# Patient Record
Sex: Male | Born: 1990 | Race: White | Hispanic: No | Marital: Married | Smoking: Current every day smoker
Health system: Southern US, Community
[De-identification: ages and names within clinical notes are randomized; demographics above are authoritative.]

---
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
## 2010-02-06 ENCOUNTER — Ambulatory Visit (HOSPITAL_COMMUNITY): Admission: RE | Admit: 2010-02-06 | Discharge: 2010-02-06 | Payer: Self-pay | Admitting: Psychiatry

## 2010-07-07 LAB — BASIC METABOLIC PANEL
Calcium: 9.3 mg/dL (ref 8.4–10.5)
GFR calc non Af Amer: >60 mL/min (ref >60)
Glucose, Bld: 97 mg/dL (ref 70–99)
Potassium: 4 mEq/L (ref 3.5–5.1)
Sodium: 139 mEq/L (ref 135–145)

## 2010-07-07 LAB — DIFFERENTIAL
Basophils Absolute: 0 10*3/uL (ref 0.0–0.1)
Basophils Relative: 0 % (ref 0–1)
Lymphocytes Relative: 20 % (ref 12–46)
Monocytes Absolute: 0.9 10*3/uL (ref 0.1–1.0)
Monocytes Relative: 9 % (ref 3–12)
Neutro Abs: 6.9 10*3/uL (ref 1.7–7.7)
Neutrophils Relative %: 67 % (ref 43–77)

## 2010-07-07 LAB — CBC
Hemoglobin: 16.3 g/dL (ref 13.0–17.0)
MCHC: 34.3 g/dL (ref 30.0–36.0)
RBC: 5.14 MIL/uL (ref 4.22–5.81)
RDW: 13.1 % (ref 11.5–15.5)

## 2010-07-07 LAB — TRICYCLICS SCREEN, URINE

## 2010-07-07 LAB — RAPID URINE DRUG SCREEN, HOSP PERFORMED
Amphetamines: NOT DETECTED
Benzodiazepines: POSITIVE — AB
Cocaine: NOT DETECTED

## 2010-07-13 LAB — COMPREHENSIVE METABOLIC PANEL
ALT: 16 U/L (ref 0–53)
AST: 20 U/L (ref 0–37)
Albumin: 4 g/dL (ref 3.5–5.2)
CO2: 25 mEq/L (ref 19–32)
Chloride: 106 mEq/L (ref 96–112)
Creatinine, Ser: 1.18 mg/dL (ref 0.4–1.5)
Potassium: 3.9 mEq/L (ref 3.5–5.1)
Sodium: 138 mEq/L (ref 135–145)
Total Bilirubin: 1.3 mg/dL — ABNORMAL HIGH (ref 0.3–1.2)

## 2010-07-13 LAB — URINE MICROSCOPIC-ADD ON

## 2010-07-13 LAB — CBC
HCT: 45.4 % (ref 36.0–49.0)
Platelets: 159 10*3/uL (ref 150–400)
RDW: 13.3 % (ref 11.4–15.5)
WBC: 14.2 10*3/uL — ABNORMAL HIGH (ref 4.5–13.5)

## 2010-07-13 LAB — URINALYSIS, ROUTINE W REFLEX MICROSCOPIC
Glucose, UA: NEGATIVE mg/dL
Hgb urine dipstick: NEGATIVE
Leukocytes, UA: NEGATIVE
Specific Gravity, Urine: 1.021 (ref 1.005–1.030)
pH: 6 (ref 5.0–8.0)

## 2010-07-13 LAB — DIFFERENTIAL
Basophils Relative: 0 % (ref 0–1)
Eosinophils Relative: 0 % (ref 0–5)
Monocytes Absolute: 1 10*3/uL (ref 0.2–1.2)
Monocytes Relative: 7 % (ref 3–11)
Neutro Abs: 12.8 10*3/uL — ABNORMAL HIGH (ref 1.7–8.0)
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
## 2013-11-26 ENCOUNTER — Emergency Department (HOSPITAL_BASED_OUTPATIENT_CLINIC_OR_DEPARTMENT_OTHER)
Admission: EM | Admit: 2013-11-26 | Discharge: 2013-11-26 | Disposition: A | Discharge status: Home / Self Care | Payer: Self-pay | Attending: Emergency Medicine | Admitting: Emergency Medicine

## 2013-11-26 ENCOUNTER — Encounter (HOSPITAL_BASED_OUTPATIENT_CLINIC_OR_DEPARTMENT_OTHER): Payer: Self-pay | Admitting: Emergency Medicine

## 2013-11-26 DIAGNOSIS — F17200 Nicotine dependence, unspecified, uncomplicated: Secondary | ICD-10-CM | POA: Insufficient documentation

## 2013-11-26 DIAGNOSIS — L237 Allergic contact dermatitis due to plants, except food: Secondary | ICD-10-CM

## 2013-11-26 DIAGNOSIS — IMO0002 Reserved for concepts with insufficient information to code with codable children: Secondary | ICD-10-CM | POA: Insufficient documentation

## 2013-11-26 DIAGNOSIS — R21 Rash and other nonspecific skin eruption: Secondary | ICD-10-CM | POA: Insufficient documentation

## 2013-11-26 DIAGNOSIS — L255 Unspecified contact dermatitis due to plants, except food: Secondary | ICD-10-CM | POA: Insufficient documentation

## 2013-11-26 MED ORDER — TRIAMCINOLONE ACETONIDE 0.1 % EX CREA: 1.0000 "application " | TOPICAL_CREAM | Freq: Two times a day (BID) | CUTANEOUS | Status: DC

## 2013-11-26 NOTE — ED Provider Notes (Signed)
CSN: [id number]     Arrival date & time 11/26/13  1214 History   First MD Initiated Contact with Patient 11/26/13 1241     Chief Complaint  Patient presents with  . Rash     (Consider location/radiation/quality/duration/timing/severity/associated sxs/prior Treatment) HPI Comments: Pt states that his daughter was diagnosed with impetigo and he wanted to make sure that he didn't have it. Works outside  Patient is a 23 y.o. male presenting with rash. The history is provided by the patient. No language interpreter was used.  Rash Location:  Leg Quality: itchiness and redness   Severity:  Mild Onset quality:  Sudden Duration:  1 week Timing:  Constant Progression:  Unchanged Chronicity:  New Relieved by:  Nothing Worsened by:  Nothing tried Ineffective treatments:  None tried Associated symptoms: no fever     History reviewed. No pertinent past medical history. History reviewed. No pertinent past surgical history. No family history on file. History  Substance Use Topics  . Smoking status: Current Every Day Smoker -- 0.50 packs/day    Types: Cigarettes  . Smokeless tobacco: Not on file  . Alcohol Use: Yes     Comment: occasional    Review of Systems  Constitutional: Negative for fever.  Respiratory: Negative.   Skin: Positive for rash.      Allergies  Review of patient's allergies indicates no known allergies.  Home Medications   Prior to Admission medications   Medication Sig Start Date End Date Taking? Authorizing Provider  triamcinolone cream (KENALOG) 0.1 % Apply 1 application topically 2 (two) times daily. 11/26/13   Teressa Lower, NP   BP 119/70  Pulse 85  Temp(Src) 97.5 F (36.4 C) (Oral)  Resp 16  Ht 6' (1.829 m)  Wt 145 lb (65.772 kg)  BMI 19.66 kg/m2  SpO2 100% Physical Exam  Nursing note and vitals reviewed. Constitutional: He is oriented to person, place, and time. He appears well-developed and well-nourished.  Cardiovascular: Normal rate  and regular rhythm.   Pulmonary/Chest: Effort normal and breath sounds normal.  Musculoskeletal: Normal range of motion.  Neurological: He is alert and oriented to person, place, and time.  Skin:  A few area of red raised papules to the bilateral ankles. No drainage or warmth noted    ED Course  Procedures (including critical care time) Labs Review Labs Reviewed - No data to display  Imaging Review No results found.   EKG Interpretation None      MDM   Final diagnoses:  Poison ivy    Consistent with poison ivy. Very localized and appears to be healing will treat with triamcinolone     Teressa Lower, NP 11/26/13 1305

## 2013-11-26 NOTE — ED Notes (Signed)
Generalized rash x1 week. 

## 2013-11-26 NOTE — Discharge Instructions (Signed)
Contact Dermatitis °Contact dermatitis is a rash that happens when something touches the skin. You touched something that irritates your skin, or you have allergies to something you touched. °HOME CARE  °· Avoid the thing that caused your rash. °· Keep your rash away from hot water, soap, sunlight, chemicals, and other things that might bother it. °· Do not scratch your rash. °· You can take cool baths to help stop itching. °· Only take medicine as told by your doctor. °· Keep all doctor visits as told. °GET HELP RIGHT AWAY IF:  °· Your rash is not better after 3 days. °· Your rash gets worse. °· Your rash is puffy (swollen), tender, red, sore, or warm. °· You have problems with your medicine. °MAKE SURE YOU:  °· Understand these instructions. °· Will watch your condition. °· Will get help right away if you are not doing well or get worse. °Document Released: 01/17/2009 Document Revised: 06/14/2011 Document Reviewed: 08/25/2010 °ExitCare® Patient Information ©2015 [employer]. This information is not intended to replace advice given to you by your health care provider. Make sure you discuss any questions you have with your health care provider. ° °

## 2013-11-27 NOTE — ED Provider Notes (Signed)
Medical screening examination/treatment/procedure(s) were performed by non-physician practitioner and as supervising physician I was immediately available for consultation/collaboration.     Geoffery Lyons, MD 11/27/13 [phone]

## 2013-12-11 ENCOUNTER — Encounter (HOSPITAL_COMMUNITY): Payer: Self-pay | Admitting: Emergency Medicine

## 2013-12-11 ENCOUNTER — Emergency Department (HOSPITAL_COMMUNITY)
Admission: EM | Admit: 2013-12-11 | Discharge: 2013-12-11 | Disposition: A | Discharge status: Home / Self Care | Payer: Self-pay | Attending: Emergency Medicine | Admitting: Emergency Medicine

## 2013-12-11 DIAGNOSIS — N201 Calculus of ureter: Secondary | ICD-10-CM

## 2013-12-11 DIAGNOSIS — F17200 Nicotine dependence, unspecified, uncomplicated: Secondary | ICD-10-CM | POA: Insufficient documentation

## 2013-12-11 DIAGNOSIS — N509 Disorder of male genital organs, unspecified: Secondary | ICD-10-CM | POA: Insufficient documentation

## 2013-12-11 DIAGNOSIS — IMO0002 Reserved for concepts with insufficient information to code with codable children: Secondary | ICD-10-CM | POA: Insufficient documentation

## 2013-12-11 LAB — URINALYSIS, ROUTINE W REFLEX MICROSCOPIC
Glucose, UA: NEGATIVE mg/dL
Ketones, ur: 40 mg/dL — AB
LEUKOCYTES UA: NEGATIVE
NITRITE: NEGATIVE
PH: 6 (ref 5.0–8.0)
Protein, ur: 30 mg/dL — AB
SPECIFIC GRAVITY, URINE: 1.03 (ref 1.005–1.030)
UROBILINOGEN UA: 2 mg/dL — AB (ref 0.0–1.0)

## 2013-12-11 LAB — BASIC METABOLIC PANEL
ANION GAP: 15 (ref 5–15)
BUN: 14 mg/dL (ref 6–23)
CHLORIDE: 103 meq/L (ref 96–112)
CO2: 23 meq/L (ref 19–32)
CREATININE: 1.33 mg/dL (ref 0.50–1.35)
Calcium: 10.2 mg/dL (ref 8.4–10.5)
GFR calc non Af Amer: 74 mL/min — ABNORMAL LOW (ref >90)
GFR, EST AFRICAN AMERICAN: 86 mL/min — AB (ref >90)
Glucose, Bld: 115 mg/dL — ABNORMAL HIGH (ref 70–99)
POTASSIUM: 4.6 meq/L (ref 3.7–5.3)
SODIUM: 141 meq/L (ref 137–147)

## 2013-12-11 LAB — URINE MICROSCOPIC-ADD ON

## 2013-12-11 LAB — CBC WITH DIFFERENTIAL/PLATELET
BASOS ABS: 0 10*3/uL (ref 0.0–0.1)
BASOS PCT: 0 % (ref 0–1)
Eosinophils Absolute: 0.1 10*3/uL (ref 0.0–0.7)
Eosinophils Relative: 1 % (ref 0–5)
HEMATOCRIT: 45.4 % (ref 39.0–52.0)
Hemoglobin: 15.5 g/dL (ref 13.0–17.0)
LYMPHS PCT: 8 % — AB (ref 12–46)
Lymphs Abs: 1.1 10*3/uL (ref 0.7–4.0)
MCH: 30.3 pg (ref 26.0–34.0)
MCHC: 34.1 g/dL (ref 30.0–36.0)
MCV: 88.8 fL (ref 78.0–100.0)
MONO ABS: 0.9 10*3/uL (ref 0.1–1.0)
Monocytes Relative: 7 % (ref 3–12)
NEUTROS ABS: 11 10*3/uL — AB (ref 1.7–7.7)
NEUTROS PCT: 84 % — AB (ref 43–77)
Platelets: 208 10*3/uL (ref 150–400)
RBC: 5.11 MIL/uL (ref 4.22–5.81)
RDW: 12.5 % (ref 11.5–15.5)
WBC: 13.1 10*3/uL — AB (ref 4.0–10.5)

## 2013-12-11 NOTE — ED Notes (Addendum)
Pt reports L groin pain radiating to his L testicle.  Pt reports urinary retention, last time he voided was at 1430 today.   Pt also reports that he thought that it was just because he's been constipated, LBM x 3 days ago.  Has used laxative with relief.  Has helped the pain some.

## 2013-12-11 NOTE — Discharge Instructions (Signed)

## 2013-12-11 NOTE — ED Provider Notes (Signed)
CSN: [id number]     Arrival date & time 12/11/13  2022 History   First MD Initiated Contact with Patient 12/11/13 2104     Chief Complaint  Patient presents with  . Testicle Pain     (Consider location/radiation/quality/duration/timing/severity/associated sxs/prior Treatment) Patient is a 23 y.o. male presenting with testicular pain. The history is provided by the patient.  Testicle Pain Associated symptoms include abdominal pain. Pertinent negatives include no chest pain, no headaches and no shortness of breath.   patient presented with testicular abdominal pain. States that around 5:30 today, 4 hours prior to arrival he developed a pain like he had to urinate. He states he couldn't really go. He then had severe pain in his left testicle. It then went up to his left abdomen. Is also some pain in his left flank. He states the pain would go between his testicle in his abdomen. No fevers. No nausea no vomiting. No diarrhea. No penile discharge. He states he did have a little constipation. He states he took an enema and that relieves constipation without release for the pain. Patient states he feels somewhat better now.  History reviewed. No pertinent past medical history. History reviewed. No pertinent past surgical history. No family history on file. History  Substance Use Topics  . Smoking status: Current Every Day Smoker -- 0.50 packs/day    Types: Cigarettes  . Smokeless tobacco: Not on file  . Alcohol Use: Yes     Comment: occasional    Review of Systems  Constitutional: Negative for activity change and appetite change.  Eyes: Negative for pain.  Respiratory: Negative for chest tightness and shortness of breath.   Cardiovascular: Negative for chest pain and leg swelling.  Gastrointestinal: Positive for abdominal pain. Negative for nausea, vomiting and diarrhea.  Genitourinary: Positive for dysuria and testicular pain. Negative for flank pain.  Musculoskeletal: Negative for back  pain and neck stiffness.  Skin: Negative for rash.  Neurological: Negative for weakness, numbness and headaches.  Psychiatric/Behavioral: Negative for behavioral problems.      Allergies  Review of patient's allergies indicates no known allergies.  Home Medications   Prior to Admission medications   Medication Sig Start Date End Date Taking? Authorizing Provider  acetaminophen (TYLENOL) 500 MG tablet Take 1,000 mg by mouth every 6 (six) hours as needed for mild pain.   Yes Historical Provider, MD  ibuprofen (ADVIL,MOTRIN) 600 MG tablet Take 600 mg by mouth every 6 (six) hours as needed for moderate pain.   Yes Historical Provider, MD  triamcinolone cream (KENALOG) 0.1 % Apply 1 application topically 2 (two) times daily. 11/26/13  Yes Teressa Lower, NP   BP 115/60  Pulse 55  Resp 22  SpO2 99% Physical Exam  Nursing note and vitals reviewed. Constitutional: He is oriented to person, place, and time. He appears well-developed and well-nourished.  HENT:  Head: Normocephalic and atraumatic.  Cardiovascular: Normal rate, regular rhythm and normal heart sounds.   No murmur heard. Pulmonary/Chest: Effort normal and breath sounds normal.  Abdominal: Soft. Bowel sounds are normal. He exhibits no distension and no mass. There is no tenderness. There is no rebound and no guarding.  Genitourinary: Prostate normal and penis normal. No penile tenderness.  Normal testicular lie bilaterally. No testicular tenderness. No hernia palpated. No CVA tenderness on left. Cremasteric reflex intact on left.  Musculoskeletal: Normal range of motion. He exhibits no edema.  Neurological: He is alert and oriented to person, place, and time. No cranial nerve deficit.  Skin: Skin is warm and dry.  Psychiatric: He has a normal mood and affect.    ED Course  Procedures (including critical care time) Labs Review Labs Reviewed  CBC WITH DIFFERENTIAL - Abnormal; Notable for the following:    WBC 13.1 (*)     Neutrophils Relative % 84 (*)    Neutro Abs 11.0 (*)    Lymphocytes Relative 8 (*)    All other components within normal limits  BASIC METABOLIC PANEL - Abnormal; Notable for the following:    Glucose, Bld 115 (*)    GFR calc non Af Amer 74 (*)    GFR calc Af Amer 86 (*)    All other components within normal limits  URINALYSIS, ROUTINE W REFLEX MICROSCOPIC - Abnormal; Notable for the following:    Color, Urine AMBER (*)    APPearance CLOUDY (*)    Hgb urine dipstick LARGE (*)    Bilirubin Urine SMALL (*)    Ketones, ur 40 (*)    Protein, ur 30 (*)    Urobilinogen, UA 2.0 (*)    All other components within normal limits  URINE MICROSCOPIC-ADD ON - Abnormal; Notable for the following:    Bacteria, UA FEW (*)    All other components within normal limits    Imaging Review No results found.   EKG Interpretation None      MDM   Final diagnoses:  Ureteral stone    Patient with flank pain abdominal pain and testicular pain. Thought to be likely kidney stone, then on the patient's urine sample he passed a visible stone. No infection. Patient improved. Will discharge home. Family has history of similar stones.    Juliet Rude. Rubin Payor, MD 12/11/13 [phone]

## 2013-12-11 NOTE — ED Notes (Signed)
Pt ambulated to bathroom 

## 2015-01-22 ENCOUNTER — Emergency Department (HOSPITAL_COMMUNITY)
Admission: EM | Admit: 2015-01-22 | Discharge: 2015-01-22 | Disposition: A | Discharge status: Home / Self Care | Payer: Worker's Compensation | Attending: Emergency Medicine | Admitting: Emergency Medicine

## 2015-01-22 ENCOUNTER — Encounter (HOSPITAL_COMMUNITY): Payer: Self-pay

## 2015-01-22 DIAGNOSIS — T24232A Burn of second degree of left lower leg, initial encounter: Secondary | ICD-10-CM | POA: Diagnosis not present

## 2015-01-22 DIAGNOSIS — Z23 Encounter for immunization: Secondary | ICD-10-CM | POA: Diagnosis not present

## 2015-01-22 DIAGNOSIS — T25222A Burn of second degree of left foot, initial encounter: Secondary | ICD-10-CM | POA: Insufficient documentation

## 2015-01-22 DIAGNOSIS — Y990 Civilian activity done for income or pay: Secondary | ICD-10-CM | POA: Insufficient documentation

## 2015-01-22 DIAGNOSIS — Y272XXA Contact with hot fluids, undetermined intent, initial encounter: Secondary | ICD-10-CM | POA: Insufficient documentation

## 2015-01-22 DIAGNOSIS — Y9289 Other specified places as the place of occurrence of the external cause: Secondary | ICD-10-CM | POA: Insufficient documentation

## 2015-01-22 DIAGNOSIS — Y9389 Activity, other specified: Secondary | ICD-10-CM | POA: Diagnosis not present

## 2015-01-22 DIAGNOSIS — Z720 Tobacco use: Secondary | ICD-10-CM | POA: Diagnosis not present

## 2015-01-22 MED ORDER — OXYCODONE-ACETAMINOPHEN 5-325 MG PO TABS: 1.0000 | ORAL_TABLET | ORAL | Status: AC | PRN

## 2015-01-22 MED ORDER — OXYCODONE-ACETAMINOPHEN 5-325 MG PO TABS: 1.0000 | ORAL_TABLET | ORAL | Status: DC | PRN

## 2015-01-22 MED ORDER — OXYCODONE-ACETAMINOPHEN 5-325 MG PO TABS
2.0000 | ORAL_TABLET | Freq: Once | ORAL | Status: AC
  Administered 2015-01-22: 2 via ORAL
  Filled 2015-01-22: qty 2

## 2015-01-22 MED ORDER — BACITRACIN ZINC 500 UNIT/GM EX OINT: 1.0000 "application " | TOPICAL_OINTMENT | Freq: Three times a day (TID) | CUTANEOUS | Status: DC

## 2015-01-22 MED ORDER — TETANUS-DIPHTH-ACELL PERTUSSIS 5-2.5-18.5 LF-MCG/0.5 IM SUSP
0.5000 mL | Freq: Once | INTRAMUSCULAR | Status: AC
  Administered 2015-01-22: 0.5 mL via INTRAMUSCULAR
  Filled 2015-01-22: qty 0.5

## 2015-01-22 MED ORDER — BACITRACIN ZINC 500 UNIT/GM EX OINT: 1.0000 "application " | TOPICAL_OINTMENT | Freq: Three times a day (TID) | CUTANEOUS | Status: AC

## 2015-01-22 MED ORDER — BACITRACIN ZINC 500 UNIT/GM EX OINT
TOPICAL_OINTMENT | Freq: Once | CUTANEOUS | Status: AC
  Administered 2015-01-22: 1 via TOPICAL
  Filled 2015-01-22: qty 0.9

## 2015-01-22 NOTE — Discharge Instructions (Signed)
Burn Care °Your skin is a natural barrier to infection. It is the largest organ of your body. Burns damage this natural protection. To help prevent infection, it is very important to follow your caregiver's instructions in the care of your burn. °Burns are classified as: °· First degree. There is only redness of the skin (erythema). No scarring is expected. °· Second degree. There is blistering of the skin. Scarring may occur with deeper burns. °· Third degree. All layers of the skin are injured, and scarring is expected. °HOME CARE INSTRUCTIONS  °· Wash your hands well before changing your bandage. °· Change your bandage as often as directed by your caregiver. °· Remove the old bandage. If the bandage sticks, you may soak it off with cool, clean water. °· Cleanse the burn thoroughly but gently with mild soap and water. °· Pat the area dry with a clean, dry cloth. °· Apply a thin layer of antibacterial cream to the burn. °· Apply a clean bandage as instructed by your caregiver. °· Keep the bandage as clean and dry as possible. °· Elevate the affected area for the first 24 hours, then as instructed by your caregiver. °· Only take over-the-counter or prescription medicines for pain, discomfort, or fever as directed by your caregiver. °SEEK IMMEDIATE MEDICAL CARE IF:  °· You develop excessive pain. °· You develop redness, tenderness, swelling, or red streaks near the burn. °· The burned area develops yellowish-white fluid (pus) or a bad smell. °· You have a fever. °MAKE SURE YOU:  °· Understand these instructions. °· Will watch your condition. °· Will get help right away if you are not doing well or get worse. °  °This information is not intended to replace advice given to you by your health care provider. Make sure you discuss any questions you have with your health care provider. °  °Document Released: 03/22/2005 Document Revised: 06/14/2011 Document Reviewed: 08/12/2010 °Elsevier Interactive Patient Education ©2016  [employer] ° °Second-Degree Burn °A second-degree burn affects the 2 outer layers of skin. The outer layer (epidermis) and the layer underneath it (dermis) are both burned. Another name for this type of burn is a partial thickness burn. A second-degree burn may be called minor or major. This depends on the size of the burn. It also depends on what parts of the skin are burned. Minor burns may be treated with first aid. Major burns are a medical emergency. °A second-degree burn is worse than a first-degree burn, but not as bad as a third-degree burn. A first-degree burn affects only the epidermis. A third-degree burn goes through all the layers of skin. A second-degree burn usually heals in 3 to 4 weeks. A minor second-degree burn usually does not leave a scar. Deeper second-degree burns may lead to scarring of the skin or contractures over joints. Contractures are scars that form over joints and may lead to reduced mobility at those joints. °CAUSES °· Heat (thermal) injury. This happens when skin comes in contact with something very hot. It could be a flame, a hot object, hot liquid, or steam. Most second-degree burns are thermal injuries. °· Radiation. Sunlight is one type of radiation that can burn the skin. Another type of radiation is used to heat food. Radiation is also used to treat some diseases, such as cancer. All types of radiation can burn the skin. Sunlight usually causes a first-degree burn. Radiation used for heating food or treating a disease can cause a second-degree burn. °· Electricity. Electrical burns can cause more   damage under the skin than on the surface. They should always be treated as major burns. °· Chemicals. Many chemicals can burn the skin. The burn should be flushed with cool water and checked by an emergency caregiver. °SYMPTOMS °Symptoms of second-degree burns include: °· Severe pain. °· Extreme tenderness. °· Deep redness. °· Blistered skin. °· Skin that has changed color. It  might look blotchy, wet, or shiny. °· Swelling. °TREATMENT °Some second-degree burns may need to be treated in a hospital. These include major burns, electrical burns, and chemical burns. Many other second-degree burns can be treated with regular first aid, such as: °· Cooling the burn. Use cool, germ-free (sterile) salt water. Place the burned area of skin into a tub of water, or cover the burned area with clean, wet towels. °· Taking pain medicine. °· Removing the dead skin from broken blisters. A trained caregiver may do this. Do not pop blisters. °· Gently washing your skin with mild soap. °· Covering the burned area with a cream. Silver sulfadiazine is a cream for burns. An antibiotic cream, such as bacitracin, may also be used to fight infection. Do not use other ointments or creams unless your caregiver says it is okay. °· Protecting the burn with a sterile, non-sticky bandage. °· Bandaging fingers and toes separately. This keeps them from sticking together. °· Taking an antibiotic. This can help prevent infection. °· Getting a tetanus shot. °HOME CARE INSTRUCTIONS °Medication °· Take any medicine prescribed by your caregiver. Follow the directions carefully. °· Ask your caregiver if you can take over-the-counter medicine to relieve pain and swelling. Do not give aspirin to children. °· Make sure your caregiver knows about all other medicines you take. This includes over-the-counter medicines. °Burn care °· You will need to change the bandage on your burn. You may need to do this 2 or 3 times each day. °¨ Gently clean the burned area. °¨ Put ointment on it. °¨ Cover the burn with a sterile bandage. °· For some deeper burns or burns that cover a large area, compression garments may be prescribed. These garments can help minimize scarring and protect your mobility. °· Do not put butter or oil on your skin. Use only the cream prescribed by your caregiver. °· Do not put ice on your burn. °· Do not break blisters  on your skin. °· Keep the bandaged area dry. You might need to take a sponge bath for awhile. Ask your caregiver when you can take a shower or a tub bath again. °· Do not scratch an itchy burn. Your caregiver may give you medicine to relieve very bad itching. °· Infection is a big danger after a second-degree burn. Tell your caregiver right away if you have signs of infection, such as: °¨ Redness or changing color in the burned area. °¨ Fluid leaking from the burn. °¨ Swelling in the burn area. °¨ A bad smell coming from the wound. °Follow-up °· Keep all follow-up appointments. This is important. This is how your caregiver can tell if your treatment is working. °· Protect your burn from sunlight. Use sunscreen whenever you go outside. Burned areas may be sensitive to the sun for up to 1 year. Exposure to the sun may also cause permanent darkening of scars. °SEEK MEDICAL CARE IF: °· You have any questions about medicines. °· You have any questions about your treatment. °· You wonder if it is okay to do a particular activity. °· You develop a fever of more than 100.5° F (38.1° C). °SEEK IMMEDIATE MEDICAL CARE IF: °·   You think your burn might be infected. It may change color, become red, leak fluid, swell, or smell bad. °· You develop a fever of more than 102° F (38.9° C). °  °This information is not intended to replace advice given to you by your health care provider. Make sure you discuss any questions you have with your health care provider. °  °Document Released: 08/24/2010 Document Revised: 06/14/2011 Document Reviewed: 08/24/2010 °Elsevier Interactive Patient Education ©2016 [employer] Inc[employer] ° °
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
## 2020-09-09 ENCOUNTER — Encounter (HOSPITAL_BASED_OUTPATIENT_CLINIC_OR_DEPARTMENT_OTHER): Payer: Self-pay | Admitting: *Deleted

## 2020-09-09 ENCOUNTER — Emergency Department (HOSPITAL_BASED_OUTPATIENT_CLINIC_OR_DEPARTMENT_OTHER): Payer: Managed Care, Other (non HMO)

## 2020-09-09 ENCOUNTER — Other Ambulatory Visit: Payer: Self-pay

## 2020-09-09 DIAGNOSIS — S1011XA Abrasion of throat, initial encounter: Secondary | ICD-10-CM | POA: Diagnosis not present

## 2020-09-09 DIAGNOSIS — S60221A Contusion of right hand, initial encounter: Secondary | ICD-10-CM | POA: Diagnosis not present

## 2020-09-09 DIAGNOSIS — F17210 Nicotine dependence, cigarettes, uncomplicated: Secondary | ICD-10-CM | POA: Diagnosis not present

## 2020-09-09 DIAGNOSIS — S6991XA Unspecified injury of right wrist, hand and finger(s), initial encounter: Secondary | ICD-10-CM | POA: Diagnosis present

## 2020-09-09 DIAGNOSIS — F419 Anxiety disorder, unspecified: Secondary | ICD-10-CM | POA: Insufficient documentation
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
## 2020-09-10 ENCOUNTER — Emergency Department (HOSPITAL_BASED_OUTPATIENT_CLINIC_OR_DEPARTMENT_OTHER)
Admission: EM | Admit: 2020-09-10 | Discharge: 2020-09-10 | Disposition: A | Discharge status: Home / Self Care | Payer: Managed Care, Other (non HMO) | Attending: Emergency Medicine | Admitting: Emergency Medicine

## 2020-09-10 DIAGNOSIS — S60221A Contusion of right hand, initial encounter: Secondary | ICD-10-CM

## 2020-09-10 DIAGNOSIS — F419 Anxiety disorder, unspecified: Secondary | ICD-10-CM

## 2020-09-10 MED ORDER — IBUPROFEN 600 MG PO TABS: 600.0000 mg | ORAL_TABLET | Freq: Four times a day (QID) | ORAL | 0 refills | Status: AC | PRN

## 2020-09-10 MED ORDER — LORAZEPAM 0.5 MG PO TABS: 0.5000 mg | ORAL_TABLET | Freq: Three times a day (TID) | ORAL | 0 refills | Status: AC | PRN

## 2020-09-10 NOTE — ED Notes (Signed)
See EDP assessment; pt ambulatory at discharge 
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
## 2023-02-14 ENCOUNTER — Emergency Department (HOSPITAL_BASED_OUTPATIENT_CLINIC_OR_DEPARTMENT_OTHER)
Admission: EM | Admit: 2023-02-14 | Discharge: 2023-02-14 | Disposition: A | Discharge status: Home / Self Care | Payer: Managed Care, Other (non HMO) | Attending: Emergency Medicine | Admitting: Emergency Medicine

## 2023-02-14 ENCOUNTER — Other Ambulatory Visit: Payer: Self-pay

## 2023-02-14 ENCOUNTER — Encounter (HOSPITAL_BASED_OUTPATIENT_CLINIC_OR_DEPARTMENT_OTHER): Payer: Self-pay | Admitting: Emergency Medicine

## 2023-02-14 DIAGNOSIS — K029 Dental caries, unspecified: Secondary | ICD-10-CM | POA: Diagnosis not present

## 2023-02-14 DIAGNOSIS — K0889 Other specified disorders of teeth and supporting structures: Secondary | ICD-10-CM | POA: Diagnosis present

## 2023-02-14 MED ORDER — PENICILLIN V POTASSIUM 500 MG PO TABS: 500.0000 mg | ORAL_TABLET | Freq: Four times a day (QID) | ORAL | 0 refills | Status: AC

## 2023-02-14 MED ORDER — IBUPROFEN 800 MG PO TABS
800.0000 mg | ORAL_TABLET | Freq: Once | ORAL | Status: AC
  Administered 2023-02-14: 800 mg via ORAL
  Filled 2023-02-14: qty 1

## 2023-02-14 NOTE — Discharge Instructions (Signed)
Listerine or salt water rinse after every meal. Antibiotics as prescribed.  Take Advil Liquid Gel 600mg  every 6 hours with 650mg  Tylenol every 6 hours as needed for pain.   [facility]- check out appointment online.

## 2023-02-14 NOTE — ED Provider Notes (Signed)
[facility] AT [facility] Provider Note   CSN: [id number] Arrival date & time: 02/14/23  1541     History  Chief Complaint  Patient presents with   Dental Pain    Eric Poole is a 32 y.o. male.  32 year old male presents with complaint of left upper and lower dental pain secondary to decay without trauma, fever, drainage. Took APAP without much improvement.        Home Medications Prior to Admission medications   Medication Sig Start Date End Date Taking? Authorizing Provider  penicillin v potassium (VEETID) 500 MG tablet Take 1 tablet (500 mg total) by mouth 4 (four) times daily for 10 days. 02/14/23 02/24/23 Yes Jeannie Fend, PA-C  acetaminophen (TYLENOL) 500 MG tablet Take 1,000 mg by mouth every 6 (six) hours as needed for mild pain.    [provider]  bacitracin ointment Apply 1 application topically 3 (three) times daily. 01/22/15   Pricilla Loveless, MD  ibuprofen (ADVIL) 600 MG tablet Take 1 tablet (600 mg total) by mouth every 6 (six) hours as needed. 09/10/20   Jacalyn Lefevre, MD  LORazepam (ATIVAN) 0.5 MG tablet Take 1 tablet (0.5 mg total) by mouth every 8 (eight) hours as needed for anxiety. 09/10/20   Jacalyn Lefevre, MD  oxyCODONE-acetaminophen (PERCOCET) 5-325 MG tablet Take 1-2 tablets by mouth every 4 (four) hours as needed for severe pain. 01/22/15   Pricilla Loveless, MD      Allergies    Shrimp [shellfish allergy]    Review of Systems   Review of Systems Negative except as per HPI Physical Exam Updated Vital Signs BP (!) 134/95 (BP Location: Right Arm)   Pulse 74   Temp 98 F (36.7 C)   Resp 18   Wt 63.5 kg   SpO2 100%   BMI 20.09 kg/m  Physical Exam Vitals and nursing note reviewed.  Constitutional:      General: He is not in acute distress.    Appearance: He is well-developed. He is not diaphoretic.  HENT:     Head: Normocephalic and atraumatic.     Jaw: No trismus.     Mouth/Throat:      Dentition: Dental tenderness and dental caries present. No gingival swelling, dental abscesses or gum lesions.     Pharynx: Oropharynx is clear. Uvula midline.  Pulmonary:     Effort: Pulmonary effort is normal.  Musculoskeletal:     Cervical back: Neck supple.  Lymphadenopathy:     Cervical: No cervical adenopathy.  Skin:    General: Skin is warm and dry.     Findings: No erythema or rash.  Neurological:     Mental Status: He is alert and oriented to person, place, and time.  Psychiatric:        Behavior: Behavior normal.     ED Results / Procedures / Treatments   Labs (all labs ordered are listed, but only abnormal results are displayed) Labs Reviewed - No data to display  EKG None  Radiology No results found.  Procedures Procedures    Medications Ordered in ED Medications  ibuprofen (ADVIL) tablet 800 mg (has no administration in time range)    ED Course/ Medical Decision Making/ A&P                                 Medical Decision Making  32 yo male with upper and  lower left dental pain secondary to decay. No trismus, no red flags. Will dc with abx, recommend listerine rinse after meals. Motrin/Tylenol, follow up with dds as soon as possible.         Final Clinical Impression(s) / ED Diagnoses Final diagnoses:  Pain, dental    Rx / DC Orders ED Discharge Orders          Ordered    penicillin v potassium (VEETID) 500 MG tablet  4 times daily        02/14/23 1614              Alden Hipp 02/14/23 1629    Charlynne Pander, MD 02/14/23 [phone]

## 2023-02-14 NOTE — ED Triage Notes (Signed)
Left upper dental pain x 2 days , pain radiated to lower jaw from left to right . , reports swelling . No dentist

## 2023-06-08 ENCOUNTER — Ambulatory Visit: Payer: Self-pay

## 2023-06-08 ENCOUNTER — Other Ambulatory Visit: Payer: Self-pay | Admitting: Family Medicine

## 2023-06-08 DIAGNOSIS — S67191A Crushing injury of left index finger, initial encounter: Secondary | ICD-10-CM

## 2023-08-04 ENCOUNTER — Emergency Department (HOSPITAL_COMMUNITY)
Admission: EM | Admit: 2023-08-04 | Discharge: 2023-08-04 | Disposition: A | Discharge status: Home / Self Care | Attending: Emergency Medicine | Admitting: Emergency Medicine

## 2023-08-04 ENCOUNTER — Encounter (HOSPITAL_COMMUNITY): Payer: Self-pay

## 2023-08-04 ENCOUNTER — Other Ambulatory Visit: Payer: Self-pay

## 2023-08-04 ENCOUNTER — Emergency Department (HOSPITAL_COMMUNITY)

## 2023-08-04 DIAGNOSIS — R079 Chest pain, unspecified: Secondary | ICD-10-CM | POA: Insufficient documentation

## 2023-08-04 DIAGNOSIS — R0602 Shortness of breath: Secondary | ICD-10-CM | POA: Insufficient documentation

## 2023-08-04 LAB — CBC
HCT: 47.4 % (ref 39.0–52.0)
Hemoglobin: 16.2 g/dL (ref 13.0–17.0)
MCH: 31.4 pg (ref 26.0–34.0)
MCHC: 34.2 g/dL (ref 30.0–36.0)
MCV: 91.9 fL (ref 80.0–100.0)
Platelets: 223 10*3/uL (ref 150–400)
RBC: 5.16 MIL/uL (ref 4.22–5.81)
RDW: 13 % (ref 11.5–15.5)
WBC: 5.8 10*3/uL (ref 4.0–10.5)
nRBC: 0 % (ref 0.0–0.2)

## 2023-08-04 LAB — BASIC METABOLIC PANEL WITH GFR
Anion gap: 8 (ref 5–15)
BUN: 11 mg/dL (ref 6–20)
CO2: 22 mmol/L (ref 22–32)
Calcium: 9.3 mg/dL (ref 8.9–10.3)
Chloride: 107 mmol/L (ref 98–111)
Creatinine, Ser: 1.04 mg/dL (ref 0.61–1.24)
GFR, Estimated: >60 mL/min (ref >60)
Glucose, Bld: 94 mg/dL (ref 70–99)
Potassium: 3.5 mmol/L (ref 3.5–5.1)
Sodium: 137 mmol/L (ref 135–145)

## 2023-08-04 LAB — TROPONIN I (HIGH SENSITIVITY): Troponin I (High Sensitivity): <2 ng/L (ref <18)

## 2023-08-04 MED ORDER — NAPROXEN 375 MG PO TABS: 375.0000 mg | ORAL_TABLET | Freq: Two times a day (BID) | ORAL | 0 refills | Status: DC

## 2023-08-04 MED ORDER — NAPROXEN 500 MG PO TABS: 500.0000 mg | ORAL_TABLET | Freq: Two times a day (BID) | ORAL | 0 refills | Status: AC

## 2023-08-04 NOTE — ED Provider Triage Note (Signed)
 Emergency Medicine Provider Triage Evaluation Note  ALANA DELO , a 33 y.o. male  was evaluated in triage.  Pt complains of a sharp pain that occurred in the front of his chest 2 days ago.  States this is intermittent and also feels like his chest is tight and achy.  Denies any shortness of breath. Denies any recent periods of immobilization, surgeries, or history of blood clot  Review of Systems  Positive: As above Negative: As above  Physical Exam  BP (!) 122/90   Pulse 73   Temp 97.9 F (36.6 C) (Oral)   Resp 16   Ht 5\' 10"  (1.778 m)   Wt 70.3 kg   SpO2 100%   BMI 22.24 kg/m  Gen:   Awake, no distress   Resp:  Normal effort  MSK:   Moves extremities without difficulty    Medical Decision Making  Medically screening exam initiated at 3:37 PM.  Appropriate orders placed.  Briana Campbell was informed that the remainder of the evaluation will be completed by another provider, this initial triage assessment does not replace that evaluation, and the importance of remaining in the ED until their evaluation is complete.     Rexie Catena, PA-C 08/04/23 1537

## 2023-08-04 NOTE — ED Provider Notes (Signed)
 [facility]GENCY DEPARTMENT AT [facility] Provider Note   CSN: [id number] Arrival date & time: 08/04/23  1453     History  Chief Complaint  Patient presents with   Chest Pain   Shortness of Breath    Eric Poole is a 33 y.o. male.  HPI   33 year old male presents to the emergency department with concern for left-sided sternal pain.  He states that this developed about 2 days ago.  It is painful with certain movements, deep inspirations and forceful expirations.  He has had an intermittent cough.  Admits that he uses strong chemicals at work that sometimes induce coughing.  But otherwise he denies any productive cough, hemoptysis, leg swelling, history of blood clots, pain radiating through to his back/abdomen, difficulty breathing/shortness of breath.  Home Medications Prior to Admission medications   Medication Sig Start Date End Date Taking? Authorizing Provider  acetaminophen  (TYLENOL ) 500 MG tablet Take 1,000 mg by mouth every 6 (six) hours as needed for mild pain.    [provider]  bacitracin  ointment Apply 1 application topically 3 (three) times daily. 01/22/15   Jerilynn Montenegro, MD  ibuprofen  (ADVIL ) 600 MG tablet Take 1 tablet (600 mg total) by mouth every 6 (six) hours as needed. 09/10/20   Haviland, Julie, MD  LORazepam  (ATIVAN ) 0.5 MG tablet Take 1 tablet (0.5 mg total) by mouth every 8 (eight) hours as needed for anxiety. 09/10/20   Haviland, Julie, MD  naproxen  (NAPROSYN ) 500 MG tablet Take 1 tablet (500 mg total) by mouth 2 (two) times daily. 08/04/23   Kimyah Frein, Gillian Lacrosse M, DO  oxyCODONE -acetaminophen  (PERCOCET) 5-325 MG tablet Take 1-2 tablets by mouth every 4 (four) hours as needed for severe pain. 01/22/15   Jerilynn Montenegro, MD      Allergies    Shrimp [shellfish allergy]    Review of Systems   Review of Systems  Constitutional:  Negative for fever.  Respiratory:  Positive for chest tightness. Negative for shortness of breath and  wheezing.   Cardiovascular:  Positive for chest pain. Negative for palpitations and leg swelling.  Gastrointestinal:  Negative for abdominal pain, diarrhea and vomiting.  Genitourinary:  Negative for flank pain.  Musculoskeletal:  Negative for back pain.  Skin:  Negative for rash.  Neurological:  Negative for headaches.    Physical Exam Updated Vital Signs BP 93/73   Pulse (!) 102   Temp 97.9 F (36.6 C) (Oral)   Resp 10   Ht 5\' 10"  (1.778 m)   Wt 70.3 kg   SpO2 100%   BMI 22.24 kg/m  Physical Exam Vitals and nursing note reviewed.  Constitutional:      General: He is not in acute distress.    Appearance: Normal appearance.  HENT:     Head: Normocephalic.     Mouth/Throat:     Mouth: Mucous membranes are moist.  Cardiovascular:     Rate and Rhythm: Normal rate and regular rhythm.  Pulmonary:     Effort: Pulmonary effort is normal. No tachypnea, accessory muscle usage or respiratory distress.     Breath sounds: No decreased breath sounds or wheezing.  Abdominal:     Palpations: Abdomen is soft.     Tenderness: There is no abdominal tenderness.  Musculoskeletal:     Right lower leg: No edema.     Left lower leg: No edema.     Comments: Reproducible tenderness to palpation along the left costochondral joint  Skin:    General:  Skin is warm.  Neurological:     Mental Status: He is alert and oriented to person, place, and time. Mental status is at baseline.  Psychiatric:        Mood and Affect: Mood normal.     ED Results / Procedures / Treatments   Labs (all labs ordered are listed, but only abnormal results are displayed) Labs Reviewed  BASIC METABOLIC PANEL WITH GFR  CBC  TROPONIN I (HIGH SENSITIVITY)  TROPONIN I (HIGH SENSITIVITY)    EKG None  Radiology DG Chest 2 View Result Date: 08/04/2023 CLINICAL DATA:  Chest pain and the breath. Left-sided chest pain. 2 days. EXAM: CHEST - 2 VIEW COMPARISON:  None Available. FINDINGS: Cardiac silhouette and  mediastinal contours are within normal limits. The lungs are clear. No pleural effusion or pneumothorax. No acute skeletal abnormality. IMPRESSION: No active cardiopulmonary disease. Electronically Signed   By: Bertina Broccoli M.D.   On: 08/04/2023 16:03    Procedures Procedures    Medications Ordered in ED Medications - No data to display  ED Course/ Medical Decision Making/ A&P                                 Medical Decision Making Amount and/or Complexity of Data Reviewed Labs: ordered. Radiology: ordered.  Risk Prescription drug management.   33 year old male presents emergency department with left-sided chest pain, started 2 days ago, persistent, worse with certain movements.  No significant respiratory symptoms or history of DVT/PE.  Patient was tachycardic in triage at this is resolved on my evaluation.  He has a normal heart rate, normal lung sounds.  EKG does not show any concerning/ischemic changes.  Chest x-ray is clear and blood work is reassuring with 1 negative troponin.  With the duration of symptoms 1 troponin will suffice.  Physical exam is very reassuring, there is reproducible tenderness to palpation along the left costochondral joint and with deep inspiration/forceful expiration.  He has normal heart rate on my exam, I have a lower suspicion for PE, ACS, dissection at this time.  This appears musculoskeletal at this time.  Discussed with the patient anti-inflammatory pain medicine and supportive measures with strict return to ED precautions.  Patient at this time appears safe and stable for discharge and close outpatient follow up. Discharge plan and strict return to ED precautions discussed, patient verbalizes understanding and agreement.        Final Clinical Impression(s) / ED Diagnoses Final diagnoses:  Chest pain, unspecified type    Rx / DC Orders ED Discharge Orders          Ordered    naproxen  (NAPROSYN ) 375 MG tablet  2 times daily,    Status:  Discontinued        08/04/23 1716    naproxen  (NAPROSYN ) 500 MG tablet  2 times daily        08/04/23 1718              Burrel Legrand M, DO 08/04/23 1753

## 2023-08-04 NOTE — ED Triage Notes (Signed)
 Pt c/o L sided chest pain and SOB for 2x days. States chest feels tight and achy.   No cardiac hx.

## 2023-08-04 NOTE — Discharge Instructions (Addendum)
 You have been seen and discharged from the emergency department.  Your EKG, blood work, heart enzyme, chest x-ray were normal.  I believe you are suffering from musculoskeletal pain along the costochondral joint.  Take anti-inflammatory pain medicine as directed.  Follow-up with your primary provider for further evaluation and further care. Take home medications as prescribed. If you have any worsening symptoms or further concerns for your health please return to an emergency department for further evaluation.
